# Patient Record
Sex: Female | Born: 1980 | Race: White | Hispanic: No | Marital: Single | Smoking: Former smoker
Health system: Southern US, Community
[De-identification: ages and names within clinical notes are randomized; demographics above are authoritative.]

## PROBLEM LIST (undated history)

## (undated) DIAGNOSIS — E611 Iron deficiency: Secondary | ICD-10-CM

## (undated) DIAGNOSIS — G932 Benign intracranial hypertension: Secondary | ICD-10-CM

## (undated) DIAGNOSIS — F419 Anxiety disorder, unspecified: Secondary | ICD-10-CM

## (undated) HISTORY — DX: Anxiety disorder, unspecified: F41.9

## (undated) HISTORY — PX: NO PAST SURGERIES: SHX2092

## (undated) HISTORY — DX: Benign intracranial hypertension: G93.2

## (undated) HISTORY — DX: Iron deficiency: E61.1

---

## 2006-02-18 ENCOUNTER — Emergency Department (HOSPITAL_COMMUNITY): Admission: EM | Admit: 2006-02-18 | Discharge: 2006-02-18 | Payer: Self-pay | Admitting: Emergency Medicine
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
## 2007-08-15 ENCOUNTER — Inpatient Hospital Stay (HOSPITAL_COMMUNITY): Admission: AD | Admit: 2007-08-15 | Discharge: 2007-08-16 | Payer: Self-pay | Admitting: Obstetrics and Gynecology

## 2007-08-17 ENCOUNTER — Encounter: Admission: RE | Admit: 2007-08-17 | Discharge: 2007-09-16 | Payer: Self-pay | Admitting: Obstetrics and Gynecology

## 2007-09-17 ENCOUNTER — Encounter: Admission: RE | Admit: 2007-09-17 | Discharge: 2007-10-16 | Payer: Self-pay | Admitting: Obstetrics and Gynecology

## 2007-10-17 ENCOUNTER — Encounter: Admission: RE | Admit: 2007-10-17 | Discharge: 2007-11-16 | Payer: Self-pay | Admitting: Obstetrics and Gynecology

## 2011-05-13 ENCOUNTER — Emergency Department (HOSPITAL_COMMUNITY)
Admission: EM | Admit: 2011-05-13 | Discharge: 2011-05-13 | Disposition: A | Discharge status: Home / Self Care | Payer: Medicaid Other | Attending: Emergency Medicine | Admitting: Emergency Medicine

## 2011-05-13 ENCOUNTER — Emergency Department (HOSPITAL_COMMUNITY): Payer: Medicaid Other

## 2011-05-13 DIAGNOSIS — S139XXA Sprain of joints and ligaments of unspecified parts of neck, initial encounter: Secondary | ICD-10-CM | POA: Insufficient documentation

## 2011-05-13 DIAGNOSIS — R51 Headache: Secondary | ICD-10-CM | POA: Insufficient documentation

## 2011-05-13 DIAGNOSIS — X58XXXA Exposure to other specified factors, initial encounter: Secondary | ICD-10-CM | POA: Insufficient documentation

## 2011-05-13 DIAGNOSIS — M542 Cervicalgia: Secondary | ICD-10-CM | POA: Insufficient documentation

## 2011-05-13 DIAGNOSIS — H538 Other visual disturbances: Secondary | ICD-10-CM | POA: Insufficient documentation

## 2011-05-13 DIAGNOSIS — H539 Unspecified visual disturbance: Secondary | ICD-10-CM | POA: Insufficient documentation

## 2011-07-28 LAB — URINALYSIS, ROUTINE W REFLEX MICROSCOPIC
Bilirubin Urine: NEGATIVE
Ketones, ur: NEGATIVE
Protein, ur: NEGATIVE
Urobilinogen, UA: 0.2
pH: 6

## 2011-07-28 LAB — CBC
HCT: 31.6 — ABNORMAL LOW
HCT: 35.2 — ABNORMAL LOW
Hemoglobin: 10.9 — ABNORMAL LOW
MCHC: 34.5
MCV: 85.2
Platelets: 216
Platelets: 285
RBC: 3.72 — ABNORMAL LOW

## 2011-07-28 LAB — RAPID URINE DRUG SCREEN, HOSP PERFORMED
Benzodiazepines: NOT DETECTED
Cocaine: NOT DETECTED
Tetrahydrocannabinol: NOT DETECTED

## 2011-07-28 LAB — URINE MICROSCOPIC-ADD ON

## 2019-05-09 ENCOUNTER — Other Ambulatory Visit: Payer: Self-pay

## 2019-05-09 ENCOUNTER — Ambulatory Visit: Payer: BC Managed Care – PPO | Admitting: Neurology

## 2019-05-09 ENCOUNTER — Encounter: Payer: Self-pay | Admitting: Neurology

## 2019-05-09 VITALS — BP 128/90 | HR 96 | Temp 97.7°F | Ht 66.0 in | Wt 196.0 lb

## 2019-05-09 DIAGNOSIS — G932 Benign intracranial hypertension: Secondary | ICD-10-CM

## 2019-05-09 MED ORDER — TOPIRAMATE 100 MG PO TABS: 100.0000 mg | ORAL_TABLET | Freq: Two times a day (BID) | ORAL | 11 refills | Status: DC

## 2019-05-09 MED ORDER — SUMATRIPTAN SUCCINATE 50 MG PO TABS: 50.0000 mg | ORAL_TABLET | ORAL | 11 refills | Status: AC | PRN

## 2019-05-09 NOTE — Patient Instructions (Signed)
Dr. Leanne Lovely  [address] [place] [place], [place] [address] [phone]

## 2019-05-09 NOTE — Progress Notes (Addendum)
PATIENT: Elizabeth PalmerHolly Owen DOB: May 07, 1981  Chief Complaint  Patient presents with  . Pseudotumor Cerebri    Reports a return of headaches 3-4 weeks ago.  She was diagnosed with pseudotumor in 2013.  She has been on both topiramate and diamox in the past.  She took medications for about 3 years before losing her job and stopping for financial reasons.  She was careful to maintain her weight but has started gaining again during the COVID-19 precautions.  She has constant pain that is unrelieved by Advil.   Marland Kitchen. PCP    Luciano CutterBujanowski, Melissa, PA-C     HISTORICAL  Elizabeth Owen is a 38 year old female, seen in request by her primary care PA Bujanowski, Melissa for evaluation of pseudotumor cerebri, initial evaluation was on May 09, 2019.  I have reviewed and summarized the referring note from the referring physician.  She had a history of anxiety, was diagnosed with pseudotumor cerebri in 2013, she presented with prolonged constant occipital area pressure headaches, woke up 1 morning with loss of vision at bilateral eye, she slept on the sofa laying awkward position with excessive neck tilt, she describes she cannot see anything from bilateral eye, lasting for 2 hours, gradually improved, she was seen by neurologist Dr. Tinnie GensJeffrey, and ophthalmologist, was diagnosed with pseudotumor cerebri, reported normal MRI of the brain, had lumbar puncture," the pressure was out of the roof", lumbar puncture did help her visual symptoms and headaches.  She did have to have repeat lumbar punctures in the following couple years, was treated with Diamox, later also added on Topamax, she moved to [place] in 2015, last lumbar puncture was in 2015, the pressure was only mildly elevated, her symptoms has much improved since then, she lost to follow-up, has stopped Diamox and Topamax.  [place], reported 10 pound weight gain over the past few months, around June 2020, she began to experience  similar symptoms when she was diagnosed with pseudotumor cerebri, bilateral occipital area constant pressure, 6 out of 10, sometimes she can go up to 8 out of 10, she denies light noise sensitivity, but movement made her headache worse, lying still helps her headache, she has tried over-the-counter Tylenol and NSAIDs without helping her symptoms, she also complains of blurry vision, seeing floaters, dark spots in her visual field,   REVIEW OF SYSTEMS: Full 14 system review of systems performed and notable only for as above All other review of systems were negative.  ALLERGIES: Allergies  Allergen Reactions  . Tetracyclines & Related Other (See Comments)    She was told it may have caused her pseudotumor cerebri.    HOME MEDICATIONS: Current Outpatient Medications  Medication Sig Dispense Refill  . DOXYCYCLINE PO Take by mouth 2 (two) times a day. She is going to start this medication for acne.    Marland Kitchen. LORazepam (ATIVAN) 0.5 MG tablet 1 (ONE) TABLET 1 2 TIMES A WEEK AS NEEDED (30 DAY SUPPLY PER DR)     No current facility-administered medications for this visit.     PAST MEDICAL HISTORY: Past Medical History:  Diagnosis Date  . Anxiety   . Iron deficiency   . Pseudotumor cerebri     PAST SURGICAL HISTORY: Past Surgical History:  Procedure Laterality Date  . NO PAST SURGERIES      FAMILY HISTORY: Family History  Problem Relation Age of Onset  . Hypertension Mother   . Diabetes Mother   . Cirrhosis Mother   .  Iron deficiency Mother   . Crohn's disease Father     SOCIAL HISTORY: Social History   Socioeconomic History  . Marital status: Single    Spouse name: Not on file  . Number of children: 2  . Years of education: 63  . Highest education level: High school graduate  Occupational History  . 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  Social Needs  . Financial resource strain: Not on file  . Food insecurity    Worry: Not on file    Inability: Not on file   . Transportation needs    Medical: Not on file    Non-medical: Not on file  Tobacco Use  . Smoking status: Former Research scientist (life sciences)  . Smokeless tobacco: Never Used  Substance and Sexual Activity  . Alcohol use: Not Currently  . Drug use: Never  . Sexual activity: Not on file  Lifestyle  . Physical activity    Days per week: Not on file    Minutes per session: Not on file  . Stress: Not on file  Relationships  . Social Herbalist on phone: Not on file    Gets together: Not on file    Attends religious service: Not on file    Active member of club or organization: Not on file    Attends meetings of clubs or organizations: Not on file    Relationship status: Not on file  . Intimate partner violence    Fear of current or ex partner: Not on file    Emotionally abused: Not on file    Physically abused: Not on file    Forced sexual activity: Not on file  Other Topics Concern  . Not on file  Social History Narrative   Lives at home with her family.   Right-handed.   Caffeine use:  12 cups per day.     PHYSICAL EXAM   Vitals:   05/09/19 0753  BP: 128/90  Pulse: 96  Temp: 97.7 F (36.5 C)  Weight: 196 lb (88.9 kg)  Height: 5\' 6"  (1.676 m)    Not recorded      Body mass index is 31.64 kg/m.  PHYSICAL EXAMNIATION:  Gen: NAD, conversant, well nourised, obese, well groomed                     Cardiovascular: Regular rate rhythm, no peripheral edema, warm, nontender. Eyes: Conjunctivae clear without exudates or hemorrhage Neck: Supple, no carotid bruits. Pulmonary: Clear to auscultation bilaterally   NEUROLOGICAL EXAM:  MENTAL STATUS: Speech:    Speech is normal; fluent and spontaneous with normal comprehension.  Cognition:     Orientation to time, place and person     Normal recent and remote memory     Normal Attention span and concentration     Normal Language, naming, repeating,spontaneous speech     Fund of knowledge   CRANIAL NERVES: CN II: Visual  fields are full to confrontation.  Pupils are round equal and briskly reactive to light. CN III, IV, VI: extraocular movement are normal. No ptosis. CN V: Facial sensation is intact to pinprick in all 3 divisions bilaterally. Corneal responses are intact.  CN VII: Face is symmetric with normal eye closure and smile. CN VIII: Hearing is normal to rubbing fingers CN IX, X: Palate elevates symmetrically. Phonation is normal. CN XI: Head turning and shoulder shrug are intact CN XII: Tongue is midline with normal movements and no atrophy.  MOTOR: There  is no pronator drift of out-stretched arms. Muscle bulk and tone are normal. Muscle strength is normal.  REFLEXES: Reflexes are 2+ and symmetric at the biceps, triceps, knees, and ankles. Plantar responses are flexor.  SENSORY: Intact to light touch, pinprick, positional sensation and vibratory sensation are intact in fingers and toes.  COORDINATION: Rapid alternating movements and fine finger movements are intact. There is no dysmetria on finger-to-nose and heel-knee-shin.    GAIT/STANCE: Posture is normal. Gait is steady with normal steps, base, arm swing, and turning. Heel and toe walking are normal. Tandem gait is normal.  Romberg is absent.   DIAGNOSTIC DATA (LABS, IMAGING, TESTING) - I reviewed patient records, labs, notes, testing and imaging myself where available.   ASSESSMENT AND PLAN  Elizabeth Owen is a 38 y.o. female   History of pseudotumor cerebri with bilateral visual loss in 2013 Recurrent symptoms since June 2020,  Complete evaluation with MRI of the brain  Refer to ophthalmology for evaluation  If there is evidence of bilateral papillary edema  Will refer her to fluoroscopy guided lumbar puncture  Start Topamax titrating to 100 mg twice a day  Imitrex 50 mg as needed for headaches   Levert FeinsteinYijun Trinidy Masterson, M.D. Ph.D.  [facility] [address], [address] [place], [place] [address] Ph: [phone] Fax:  [phone]  CC: Luciano CutterBujanowski, Melissa, PA-C  [facility] there was no disc edema, CRD 0.4 on left side, 0.1 on right side, Disk pallor bilaterally

## 2019-05-10 ENCOUNTER — Telehealth: Payer: Self-pay | Admitting: Neurology

## 2019-05-10 NOTE — Telephone Encounter (Signed)
i spoke to the patient due to the cost she is going to think about it  [employer] Auth: [id number] (exp. 05/09/19 to 06/07/19)

## 2019-05-10 NOTE — Telephone Encounter (Signed)
I also offered her a payment plan. She stated she still needed to think about it.

## 2019-05-10 NOTE — Telephone Encounter (Signed)
Patient called back and is scheduled for 05/16/19 at [facility]. No to the covid questions.

## 2019-05-11 ENCOUNTER — Telehealth: Payer: Self-pay | Admitting: Neurology

## 2019-05-11 NOTE — Telephone Encounter (Signed)
Pt states topiramate (TOPAMAX) 100 MG tablet is a terrible medicaton, it foggs her brain, she is unable to think clearly.  Pt states its extremely difficult to work with this medication in her system.  Pt asking for a call to discuss coming off of this medication as soon as possible

## 2019-05-14 NOTE — Telephone Encounter (Signed)
I have spoken with the patient and she is going to back off her dosage and try to slowly titrate up to the 100mg  BID, as tolerated.  She only took the evening dose last night and is already feeling better.  She will let us know if the side effects continue to be problematic after working her way back up to the target dose.

## 2019-05-16 ENCOUNTER — Other Ambulatory Visit: Payer: Self-pay

## 2019-05-16 ENCOUNTER — Ambulatory Visit: Payer: BC Managed Care – PPO

## 2019-05-16 DIAGNOSIS — G932 Benign intracranial hypertension: Secondary | ICD-10-CM | POA: Diagnosis not present

## 2019-05-17 ENCOUNTER — Telehealth: Payer: Self-pay | Admitting: *Deleted

## 2019-05-17 NOTE — Telephone Encounter (Signed)
I have spoken with the patient and she is aware of her MRI results.

## 2019-05-17 NOTE — Telephone Encounter (Signed)
-----   Message from Marcial Pacas, MD sent at 05/17/2019  4:11 PM EDT ----- Please call pt for normal MRI brain.

## 2019-05-24 ENCOUNTER — Telehealth: Payer: Self-pay | Admitting: Neurology

## 2019-05-24 DIAGNOSIS — G932 Benign intracranial hypertension: Secondary | ICD-10-CM

## 2019-05-24 MED ORDER — ACETAZOLAMIDE ER 500 MG PO CP12: 500.0000 mg | ORAL_CAPSULE | Freq: Two times a day (BID) | ORAL | 11 refills | Status: AC

## 2019-05-24 NOTE — Telephone Encounter (Signed)
I called pt about having side effects with taking the topamax.She has taken it bid and she has tried only taking it at night. With both regimens she is having a hard time concentrating at home or work..Its hard for her to focus on things and her brain feels foggy. I stated message will be sent to Dr Krista Blue. Pt verbalized understanding.

## 2019-05-24 NOTE — Telephone Encounter (Signed)
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 Please advise.

## 2019-05-24 NOTE — Telephone Encounter (Signed)
I have called patient  [facility] on May 09, 2019 there was no disc edema, CRD 0.4 on left side, 0.1 on right side, Disk pallor bilaterally  MRI wo brain on May 17 2019 was normal.  She still complains of pressure at the base of her skull, very similar to the symptoms she had before for her pseudotumor cerebri, she could not tolerate Topamax even lower dose due to brain foggy sensation  Will change to Diamox 500 mg twice daily,

## 2019-05-24 NOTE — Telephone Encounter (Signed)
Message sent to Dr. Yan for review.

## 2019-06-13 ENCOUNTER — Ambulatory Visit: Payer: BC Managed Care – PPO | Admitting: Neurology
